# Patient Record
Sex: Female | Born: 1965 | Hispanic: No | Smoking: Former smoker
Health system: Southern US, Community
[De-identification: ages and names within clinical notes are randomized; demographics above are authoritative.]

## PROBLEM LIST (undated history)

## (undated) DIAGNOSIS — IMO0002 Reserved for concepts with insufficient information to code with codable children: Secondary | ICD-10-CM

## (undated) DIAGNOSIS — J189 Pneumonia, unspecified organism: Secondary | ICD-10-CM

## (undated) DIAGNOSIS — N63 Unspecified lump in unspecified breast: Secondary | ICD-10-CM

## (undated) DIAGNOSIS — N644 Mastodynia: Secondary | ICD-10-CM

## (undated) DIAGNOSIS — N809 Endometriosis, unspecified: Secondary | ICD-10-CM

## (undated) DIAGNOSIS — Z8742 Personal history of other diseases of the female genital tract: Secondary | ICD-10-CM

## (undated) HISTORY — DX: Unspecified lump in unspecified breast: N63.0

## (undated) HISTORY — DX: Personal history of other diseases of the female genital tract: Z87.42

## (undated) HISTORY — DX: Mastodynia: N64.4

## (undated) HISTORY — DX: Pneumonia, unspecified organism: J18.9

## (undated) HISTORY — PX: LAPAROSCOPIC ABDOMINAL EXPLORATION: SHX6249

## (undated) HISTORY — DX: Reserved for concepts with insufficient information to code with codable children: IMO0002

## (undated) HISTORY — DX: Endometriosis, unspecified: N80.9

---

## 1998-10-19 ENCOUNTER — Inpatient Hospital Stay (HOSPITAL_COMMUNITY): Admission: AD | Admit: 1998-10-19 | Discharge: 1998-10-19 | Payer: Self-pay | Admitting: *Deleted

## 1999-10-20 ENCOUNTER — Inpatient Hospital Stay (HOSPITAL_COMMUNITY): Admission: AD | Admit: 1999-10-20 | Discharge: 1999-10-20 | Payer: Self-pay | Admitting: Obstetrics & Gynecology

## 1999-12-16 ENCOUNTER — Inpatient Hospital Stay (HOSPITAL_COMMUNITY): Admission: AD | Admit: 1999-12-16 | Discharge: 1999-12-18 | Payer: Self-pay | Admitting: Obstetrics and Gynecology

## 1999-12-19 ENCOUNTER — Encounter: Admission: RE | Admit: 1999-12-19 | Discharge: 2000-03-18 | Payer: Self-pay | Admitting: *Deleted

## 2000-03-20 ENCOUNTER — Encounter: Admission: RE | Admit: 2000-03-20 | Discharge: 2000-05-19 | Payer: Self-pay | Admitting: *Deleted

## 2000-06-17 ENCOUNTER — Encounter: Admission: RE | Admit: 2000-06-17 | Discharge: 2000-07-05 | Payer: Self-pay | Admitting: *Deleted

## 2002-04-28 ENCOUNTER — Other Ambulatory Visit: Admission: RE | Admit: 2002-04-28 | Discharge: 2002-04-28 | Payer: Self-pay | Admitting: Obstetrics and Gynecology

## 2003-02-27 ENCOUNTER — Encounter: Admission: RE | Admit: 2003-02-27 | Discharge: 2003-02-27 | Payer: Self-pay | Admitting: Internal Medicine

## 2003-03-09 ENCOUNTER — Encounter: Admission: RE | Admit: 2003-03-09 | Discharge: 2003-03-09 | Payer: Self-pay | Admitting: Internal Medicine

## 2003-08-14 ENCOUNTER — Other Ambulatory Visit: Admission: RE | Admit: 2003-08-14 | Discharge: 2003-08-14 | Payer: Self-pay | Admitting: Obstetrics and Gynecology

## 2003-09-11 ENCOUNTER — Encounter: Admission: RE | Admit: 2003-09-11 | Discharge: 2003-09-11 | Payer: Self-pay | Admitting: Obstetrics and Gynecology

## 2004-07-26 ENCOUNTER — Other Ambulatory Visit: Admission: RE | Admit: 2004-07-26 | Discharge: 2004-07-26 | Payer: Self-pay | Admitting: Obstetrics and Gynecology

## 2006-05-23 ENCOUNTER — Encounter: Admission: RE | Admit: 2006-05-23 | Discharge: 2006-05-23 | Payer: Self-pay | Admitting: Internal Medicine

## 2006-10-18 ENCOUNTER — Encounter: Admission: RE | Admit: 2006-10-18 | Discharge: 2006-10-18 | Payer: Self-pay | Admitting: Obstetrics and Gynecology

## 2007-05-13 ENCOUNTER — Encounter: Admission: RE | Admit: 2007-05-13 | Discharge: 2007-05-13 | Payer: Self-pay | Admitting: Internal Medicine

## 2007-12-19 ENCOUNTER — Encounter: Admission: RE | Admit: 2007-12-19 | Discharge: 2007-12-19 | Payer: Self-pay | Admitting: Obstetrics and Gynecology

## 2007-12-26 ENCOUNTER — Encounter: Admission: RE | Admit: 2007-12-26 | Discharge: 2007-12-26 | Payer: Self-pay | Admitting: Obstetrics and Gynecology

## 2010-02-26 ENCOUNTER — Encounter: Payer: Self-pay | Admitting: Internal Medicine

## 2010-02-27 ENCOUNTER — Encounter: Payer: Self-pay | Admitting: Obstetrics and Gynecology

## 2010-05-06 ENCOUNTER — Other Ambulatory Visit: Payer: Self-pay | Admitting: Internal Medicine

## 2010-05-06 ENCOUNTER — Ambulatory Visit
Admission: RE | Admit: 2010-05-06 | Discharge: 2010-05-06 | Disposition: A | Discharge status: Home / Self Care | Payer: Self-pay | Source: Ambulatory Visit | Attending: Internal Medicine | Admitting: Internal Medicine

## 2010-05-06 DIAGNOSIS — R05 Cough: Secondary | ICD-10-CM

## 2010-06-24 NOTE — Op Note (Signed)
[facility]  Patient:    ASANI, DENISTON           MRN: [id number] Proc. Date: 12/16/99 Adm. Date:  [id number] Attending:  Dierdre Forth Pearline                           Operative Report  PREOPERATIVE DIAGNOSES:       Fetal tachycardia, maternal exhaustion.  OBSTETRICIAN:                 Cecilio Asper, M.D.  PROCEDURE:                    Low forceps vaginal delivery.  ANESTHESIA:                   Epidural.  ESTIMATED BLOOD LOSS:         350 cc.  FINDINGS:                     Viable female infant.  Apgars 9 and 9.  Weight ______.  ______ meconium.  COMPLICATIONS:                None.  HISTORY:                      Patient is a 45 year old gravida 1, para 0 who presented in active labor.  She progressed with augmentation of Pitocin to complete dilatation.  The patient progressed somewhat ineffectively for close to two hours with epidural anesthesia.  At that point the patient stated that she could not push anymore and was feeling exhausted.  Patients temperature was greater than 101 and did not resolve despite Tylenol or antibiotics and increasing IV fluids.  Fetal heart tracing at that point was between 160s-170s.  There was good short-term beat to beat variability, little long-term beat to beat variability with a negative CST.  At that point operative vaginal delivery was offered to the patient and her family.  The vertex was felt to be directed away.  The pelvis was felt to be adequate. Again, maternal pushing effort was fair.  Contractions on Pitocin were felt to be strong.  There were no contraindications to operative vaginal delivery and the patient and her husband consented.  DESCRIPTION OF PROCEDURE:     Foley was removed from the patients bladder as well as complete internal monitoring.  The ______ Lodema Hong forceps were placed x 1 without difficulty.  Vaginal suture was directly in the midline of the blade.   With the next maternal pushing effort, the left hand on the forceps shank and my right hand on the blade, the infant was brought to crowning.  The forceps were then removed and Nigel Bridgeman, C.N.M. proceeded to complete the delivery.  Please see her note in the patients chart.  After delivery of the infant inspection of the vagina and cervix was performed.  There were no sulcus or cervical lacerations.  There was a midline second degree laceration that Nigel Bridgeman felt comfortable repairing.  At this point this Kelson Queenan left the room and mother and infant were doing well. DD:  12/16/99 TD:  12/16/99 Job: [id number]

## 2010-06-24 NOTE — Discharge Summary (Signed)
[facility]  Patient:    Michelle Hendrix, Michelle Hendrix           MRN: [id number] Adm. Date:  [id number] Disc. Date: 12/18/99 Attending:  Shaune Spittle Dictator:   Mack Guise, C.N.M.                           Discharge Summary  ADMITTING DIAGNOSIS:          Intrauterine pregnancy at term in labor.  PROCEDURES:                   Low forceps vaginal delivery.  DISCHARGE DIAGNOSIS:          Fetal tachycardia and maternal exhaustion with low forceps vaginal delivery.  HISTORY:                      The patient is a 45 year old gravida 1, para 0 at term who presented in active labor.  Her labor was complicated by fetal tachycardia and maternal exhaustion.  She underwent low forceps vaginal delivery by Dr. Cleatrice Burke with birth of a viable female infant named Michelle Hendrix.  Weight was 7 pounds 15 ounces with Apgar scores of 9 at one minute and 9 at five minutes.  Her hemoglobin on the first postpartum day was 8.8 and white blood cell count was 26.6.  Today, the second postpartum day, white blood cell count 18.3 and hemoglobin 7.7, platelets 164,000.  The patients postoperative course has been uncomplicated and she is judged to be in satisfactory condition for discharge.  DISCHARGE INSTRUCTIONS        Per [facility] handout.  DISCHARGE MEDICATIONS:        1. Motrin 600 mg p.o. q.6h. p.r.n. pain.                               2. Tylox, 1-2 p.o. q.3-4h. p.r.n. pain.                               3. Prenatal vitamins.  NOTE:                         The patient will decide on contraception at her six week postpartum visit.  FOLLOW UP:                   At [facility] in six weeks. DD:  12/18/99 TD:  12/18/99 Job: [id number]

## 2010-06-24 NOTE — H&P (Signed)
[facility]  Patient:    Michelle Hendrix, Michelle Hendrix           MRN: [id number] Adm. Date:  [id number] Disc. Date: [id number] Attending:  Cleatrice Burke Dictator:   Mack Guise, C.N.M.                         History and Physical  HISTORY OF PRESENT ILLNESS:   The patient is a 45 year old gravida 1, para 0 at 39-6/7ths weeks, EDD December 16, 1999, who presents with increasing signs and symptoms of labor.  She reports positive fetal movement, no bleeding, no rupture of membranes.  Denies any headache, visual changes or epigastric pain. She was initially evaluated about 12:30 this a.m. and was 1 cm dilated, she ambulated for one hour and has now progressed to 3, 90%, -1 and is admitted for labor.  Her pregnancy has been followed by the C.N.M. service at [facility] and is remarkable for: 1. A history of endometriosis.  2. First trimester bleeding.  3. History of cholelithiasis with no surgery.  4. Group B strep negative.  This patient was initially evaluated at the office of [facility] on July 22, 1999, at approximately [redacted] weeks gestation.  Her pregnancy has been essentially uncomplicated.  PRENATAL LABORATORY WORK:     Finds hemoglobin 13.4, platelets 206,000.  Blood type and Rh O positive.  Antibody screen negative.  VDRL nonreactive.  Rubella positive.  Hepatitis B surface antigen negative.  HIV negative.  MSAFP testing within normal limits.  One-hour glucose challenge 123 and at 36 weeks culture of the vaginal tract is negative for group B strep.  MEDICAL HISTORY:              History of endometrial and ovarian cysts removed by laparoscopy.  The patient has a history of hyperthyroidism for which she took medication for eight years; lab work has all been within normal limits at present.  FAMILY HISTORY:               The patients mother has a history of MI, father with varicosities.  The patients mother has a history of diabetes.  GENETIC HISTORY:               There is no family history of genetic or chromosomal disorders, children that died in infancy or that were born with birth defects.  ALLERGIES:                    No known drug allergies.  HABITS:                       The patient denies the use of tobacco, alcohol or illicit drugs.  REVIEW OF SYSTEMS:            There are no signs or symptoms suggestive of focal or systemic disease, and the patient is typical of one with a uterine pregnancy at term in early labor.  SOCIAL HISTORY:               The patient is a 45 year old [place] married female.  Her husband, Zlatco, is involved and supportive.  They are of the [place] faith.  PHYSICAL EXAMINATION:  VITAL SIGNS:                  Vital signs stable, afebrile.  HEENT:  Unremarkable.  LUNGS:                        Clear.  HEART:                        Regular rate and rhythm.  ABDOMEN:                      Soft and nontender, gravid in its contour. Uterine fundus is noted to extend 40 cm above the level of the pubic symphysis.  Leopolds maneuver finds the infant to be in a longitudinal lie, cephalic presentation and the estimated fetal weight is 7.5-8 pounds.  PELVIC:                       Digital exam of the cervix finds it to be 3 cm dilated, 90% effaced with the cephalic presenting part at a -1 station and membranes intact.  ASSESSMENT:                   1. Intrauterine pregnancy at term.                               2. Early labor.  PLAN:                         1. Admit to birthing suites.                               2. Routine C.N.M. orders.                               3. Heparin lock IV.                               4. Stadol 1 mg plus Phenergan 12.5 mg IV for                                  pain.                               5. Follow expectantly in anticipation of a                                  spontaneous vaginal delivery.                               6. The patient  and her husband verbalized                                  understanding and agreement with above plan. DD:  12/16/99 TD:  12/16/99 Job: [id number]

## 2011-11-23 ENCOUNTER — Encounter: Payer: BC Managed Care – PPO | Admitting: Physical Therapy

## 2011-11-30 ENCOUNTER — Encounter: Payer: BC Managed Care – PPO | Admitting: Physical Therapy

## 2011-12-08 DIAGNOSIS — IMO0002 Reserved for concepts with insufficient information to code with codable children: Secondary | ICD-10-CM

## 2011-12-08 HISTORY — DX: Reserved for concepts with insufficient information to code with codable children: IMO0002

## 2011-12-17 DIAGNOSIS — IMO0002 Reserved for concepts with insufficient information to code with codable children: Secondary | ICD-10-CM | POA: Insufficient documentation

## 2012-01-02 ENCOUNTER — Encounter: Payer: Self-pay | Admitting: Obstetrics and Gynecology

## 2012-01-02 ENCOUNTER — Ambulatory Visit (INDEPENDENT_AMBULATORY_CARE_PROVIDER_SITE_OTHER): Payer: BC Managed Care – PPO | Admitting: Obstetrics and Gynecology

## 2012-01-02 ENCOUNTER — Other Ambulatory Visit: Payer: Self-pay | Admitting: Internal Medicine

## 2012-01-02 VITALS — BP 106/78 | HR 76 | Ht 65.0 in | Wt 180.0 lb

## 2012-01-02 DIAGNOSIS — R52 Pain, unspecified: Secondary | ICD-10-CM

## 2012-01-02 DIAGNOSIS — R350 Frequency of micturition: Secondary | ICD-10-CM

## 2012-01-02 DIAGNOSIS — R140 Abdominal distension (gaseous): Secondary | ICD-10-CM

## 2012-01-02 DIAGNOSIS — Z01419 Encounter for gynecological examination (general) (routine) without abnormal findings: Secondary | ICD-10-CM

## 2012-01-02 DIAGNOSIS — Z124 Encounter for screening for malignant neoplasm of cervix: Secondary | ICD-10-CM

## 2012-01-02 DIAGNOSIS — R141 Gas pain: Secondary | ICD-10-CM

## 2012-01-02 LAB — POCT URINALYSIS DIPSTICK
Bilirubin, UA: NEGATIVE
Ketones, UA: NEGATIVE
Spec Grav, UA: 1.01
pH, UA: 5

## 2012-01-02 NOTE — Progress Notes (Signed)
Subjective:    Michelle Hendrix is a 46 y.o. female, G1P1, who presents for an annual exam. She is not sexually active.  She complains of bloating.  She has not had a period for 3 months.  She does have symptoms like a period is about to start. She complains of constipation. She has a history of ovarian cysts. She admits that she is not exercising.  She is now divorced.  She does receive child support.    History   Social History  . Marital Status: Married    Spouse Name: N/A    Number of Children: N/A  . Years of Education: N/A   Social History Main Topics  . Smoking status: Former Games developer  . Smokeless tobacco: None  . Alcohol Use: No  . Drug Use: No  . Sexually Active: Not Currently    Birth Control/ Protection: Abstinence   Other Topics Concern  . None   Social History Narrative  . None    Menstrual cycle:   LMP: Patient's last menstrual period was 10/30/2011.           Cycle: no periods for 3 months.  The following portions of the patient's history were reviewed and updated as appropriate: allergies, current medications, past family history, past medical history, past social history, past surgical history and problem list.  Review of Systems Pertinent items are noted in HPI. Breast:Negative for breast lump,nipple discharge or nipple retraction Gastrointestinal: Negative for abdominal pain, change in bowel habits or rectal bleeding Urinary:negative   Objective:    BP 106/78  Pulse 76  Ht 5\' 5"  (1.651 m)  Wt 180 lb (81.647 kg)  BMI 29.95 kg/m2  LMP 10/30/2011    Weight:  Wt Readings from Last 1 Encounters:  01/02/12 180 lb (81.647 kg)          BMI: Body mass index is 29.95 kg/(m^2).  General Appearance: Alert, appropriate appearance for age. No acute distress HEENT: Grossly normal Neck / Thyroid: Supple, no masses, nodes or enlargement Lungs: clear to auscultation bilaterally Back: No CVA tenderness Breast Exam: No masses or nodes.No dimpling,  nipple retraction or discharge. Cardiovascular: Regular rate and rhythm. S1, S2, no murmur Gastrointestinal: Soft, non-tender, no masses or organomegaly  ++++++++++++++++++++++++++++++++++++++++++++++++++++++++  Pelvic Exam: External genitalia: normal general appearance Vaginal: normal without tenderness, induration or masses and relaxation noted. Cervix: normal appearance Adnexa: normal bimanual exam Uterus: normal size shape and consistency Rectovaginal: normal rectal, no masses  ++++++++++++++++++++++++++++++++++++++++++++++++++++++++  Lymphatic Exam: Non-palpable nodes in neck, clavicular, axillary, or inguinal regions Neurologic: Normal speech, no tremor  Psychiatric: Alert and oriented, appropriate affect.   Assessment:    Normal gyn exam   Overweight or obese: Yes   Pelvic relaxation: Yes  Irregular menstrual cycles consistent with perimenopause  Abdominal bloating  Constipation   Plan:    mammogram pap smear Contraception:abstinence  GYN ultrasound in 4 weeks.   STD screen request: No   The updated Pap smear screening guidelines were discussed with the patient. The patient requested that I obtain a Pap smear: Yes.  Kegel exercises discussed: Yes.  Proper diet and regular exercise were reviewed.  Annual mammograms recommended starting at age 34. Proper breast care was discussed.  Screening colonoscopy is recommended beginning at age 28.  Regular health maintenance was reviewed.  Sleep hygiene was discussed.  Adequate calcium and vitamin D intake was emphasized.  Leonard Schwartz M.D.    Regular Periods: no Mammogram: yes  Monthly Breast Ex.: yes Exercise: no  Tetanus < 10 years: yes Seatbelts: yes  NI. Bladder Functn.: no, frequency  Abuse at home: no  Daily BM's: no, constipation  Stressful Work: no  Healthy Diet: yes tries  Sigmoid-Colonoscopy: n/a  Calcium: no Medical problems this year: irregular periods    LAST PAP: 12/22/09  wnl  Contraception: Abstinence  Mammogram:  12/26/07  PCP: Dr. Daiva Eves  PMH: anemia   FMH: no changes  Last Bone Scan:  n/a

## 2012-01-03 LAB — PAP IG W/ RFLX HPV ASCU

## 2012-01-09 ENCOUNTER — Ambulatory Visit
Admission: RE | Admit: 2012-01-09 | Discharge: 2012-01-09 | Disposition: A | Discharge status: Home / Self Care | Payer: BC Managed Care – PPO | Source: Ambulatory Visit | Attending: Internal Medicine | Admitting: Internal Medicine

## 2012-01-09 DIAGNOSIS — R52 Pain, unspecified: Secondary | ICD-10-CM

## 2012-01-18 ENCOUNTER — Other Ambulatory Visit: Payer: Self-pay | Admitting: Obstetrics and Gynecology

## 2012-01-18 ENCOUNTER — Telehealth: Payer: Self-pay | Admitting: Obstetrics and Gynecology

## 2012-01-18 NOTE — Telephone Encounter (Signed)
Pt states she has been bleeding for 12 days and is feeling weak and dizziness.

## 2012-01-19 ENCOUNTER — Other Ambulatory Visit: Payer: Self-pay | Admitting: Obstetrics and Gynecology

## 2012-01-19 ENCOUNTER — Ambulatory Visit (INDEPENDENT_AMBULATORY_CARE_PROVIDER_SITE_OTHER): Payer: BC Managed Care – PPO | Admitting: Obstetrics and Gynecology

## 2012-01-19 ENCOUNTER — Encounter: Payer: Self-pay | Admitting: Obstetrics and Gynecology

## 2012-01-19 ENCOUNTER — Telehealth: Payer: Self-pay

## 2012-01-19 ENCOUNTER — Ambulatory Visit (INDEPENDENT_AMBULATORY_CARE_PROVIDER_SITE_OTHER): Payer: BC Managed Care – PPO

## 2012-01-19 VITALS — BP 110/60 | Resp 16 | Wt 180.0 lb

## 2012-01-19 DIAGNOSIS — R140 Abdominal distension (gaseous): Secondary | ICD-10-CM

## 2012-01-19 DIAGNOSIS — N926 Irregular menstruation, unspecified: Secondary | ICD-10-CM

## 2012-01-19 DIAGNOSIS — R141 Gas pain: Secondary | ICD-10-CM

## 2012-01-19 DIAGNOSIS — N83209 Unspecified ovarian cyst, unspecified side: Secondary | ICD-10-CM

## 2012-01-19 DIAGNOSIS — N809 Endometriosis, unspecified: Secondary | ICD-10-CM

## 2012-01-19 MED ORDER — MEDROXYPROGESTERONE ACETATE 10 MG PO TABS: 10.0000 mg | ORAL_TABLET | Freq: Every day | ORAL | Status: DC

## 2012-01-19 NOTE — Telephone Encounter (Signed)
Pt was called to schedule colpo  Pt not ava  LM for pt to call back

## 2012-01-19 NOTE — Progress Notes (Signed)
Subjective:    Michelle Hendrix is a 46 y.o. female, G1P1, who presents for Gyn ultrasound because of Heavy Bleeding . The patient did not have a period for 3 months. She has now had a cycle for 12 days. The patient reports that the bloating and she wants had has now resolved. She is currently being treated by her family physician for anemia. The patient has a past history of endometriosis. Laparoscopy was performed 15 years ago. An ultrasound in October 2012 shows that her right ovary measured 5.7 x 3.7 cm. The appearance was consistent with an endometrioma or perhaps a dermoid cysts. Repeat laparoscopy was discussed/tear but then declined.  The following portions of the patient's history were reviewed and updated as appropriate: allergies, current medications, past family history.  Objective:    BP 110/60  Resp 16  Wt 180 lb (81.647 kg)  LMP 01/08/2012    Weight:  Wt Readings from Last 1 Encounters:  01/19/12 180 lb (81.647 kg)          BMI: There is no height on file to calculate BMI.  Abdomen: Soft and nontender. No masses are appreciated.  Pelvic exam: Deferred because of menstrual cycle.  ULTRASOUND: Uterus 9.5 x 6.8 cm    Adnexa left ovary is normal. Right ovary has 3 cystic structures with the largest measuring 2.7 x 2.2 cm. This seemed consistent with endometriosis.    Endometrium 11.7 mm    Other findings:  No nodularity is seen. Normal Dopplers.   Assessment:    irregular menstrual bleeding   Right ovarian cyst consistent with endometriosis  Improved bloating   Plan:    The patient wants Provera 10 mg each day for 10 days to stop her bleeding. I recommended that the patient allow her bleeding to take its course.  CT scan versus laparoscopy were discussed. Risk and benefits reviewed. The patient wants to check her insurance benefits for a CT scan. She will call and let Michelle know how she wants to proceed. CA 125 was discussed but declined from now.  [name]

## 2012-01-22 ENCOUNTER — Encounter: Payer: BC Managed Care – PPO | Admitting: Obstetrics and Gynecology

## 2012-01-22 ENCOUNTER — Other Ambulatory Visit: Payer: BC Managed Care – PPO

## 2012-01-22 ENCOUNTER — Telehealth: Payer: Self-pay | Admitting: Obstetrics and Gynecology

## 2012-01-22 NOTE — Telephone Encounter (Signed)
Late entry from 01/18/12:  Pt called, states has been having bleeding x 12 days and for the past 7 days has been having to change a soaked regular pad Q 2 hrs.  Is now feeling dizzy and weak and shivering, this is all after not having a cycle x 2 1/2 months.  Consulted w/ AVS, pt offered an appt to come in tomorrow (01/19/12) for an [name] and f/u visit with him.  Pt can only be allowed to get off of work @ 1030 and says she will be able to get to the office by 1045.  Discussed w/ sonographers after reviewing schedule.  Per Diane, if pt can get here by 1045 will work her in for an [name] before a visit w/ AVS.  Pt states she will be here.  Will have f/u w/ AVS @ 1100.

## 2012-02-16 DIAGNOSIS — N920 Excessive and frequent menstruation with regular cycle: Secondary | ICD-10-CM | POA: Insufficient documentation

## 2012-02-23 ENCOUNTER — Other Ambulatory Visit: Payer: Self-pay | Admitting: Obstetrics and Gynecology

## 2012-02-23 DIAGNOSIS — Z1231 Encounter for screening mammogram for malignant neoplasm of breast: Secondary | ICD-10-CM

## 2012-02-26 ENCOUNTER — Ambulatory Visit
Admission: RE | Admit: 2012-02-26 | Discharge: 2012-02-26 | Disposition: A | Discharge status: Home / Self Care | Payer: BC Managed Care – PPO | Source: Ambulatory Visit | Attending: Obstetrics and Gynecology | Admitting: Obstetrics and Gynecology

## 2012-02-26 DIAGNOSIS — Z1231 Encounter for screening mammogram for malignant neoplasm of breast: Secondary | ICD-10-CM

## 2012-02-27 ENCOUNTER — Telehealth: Payer: Self-pay | Admitting: Obstetrics and Gynecology

## 2012-02-27 DIAGNOSIS — N644 Mastodynia: Secondary | ICD-10-CM

## 2012-02-27 NOTE — Telephone Encounter (Signed)
Sent message to AVS asking was it ok to place mammogram orders.  [facility] CMA

## 2012-02-29 NOTE — Telephone Encounter (Signed)
Orders for diagnostic MMG and u/s put into system. Contacted [facility] they will call pt with apt. Pt aware and voiced understanding  Darien Ramus, CMA

## 2012-02-29 NOTE — Addendum Note (Signed)
Addended by: Darien Ramus on: 02/29/2012 09:39 AM   Modules accepted: Orders

## 2012-03-01 ENCOUNTER — Other Ambulatory Visit: Payer: Self-pay

## 2012-03-01 DIAGNOSIS — N6452 Nipple discharge: Secondary | ICD-10-CM

## 2012-03-01 DIAGNOSIS — N644 Mastodynia: Secondary | ICD-10-CM

## 2012-03-12 ENCOUNTER — Ambulatory Visit
Admission: RE | Admit: 2012-03-12 | Discharge: 2012-03-12 | Disposition: A | Discharge status: Home / Self Care | Payer: BC Managed Care – PPO | Source: Ambulatory Visit | Attending: Obstetrics and Gynecology | Admitting: Obstetrics and Gynecology

## 2012-03-12 DIAGNOSIS — N644 Mastodynia: Secondary | ICD-10-CM

## 2012-04-01 ENCOUNTER — Ambulatory Visit: Payer: BC Managed Care – PPO | Admitting: Obstetrics and Gynecology

## 2012-04-01 ENCOUNTER — Encounter: Payer: Self-pay | Admitting: Obstetrics and Gynecology

## 2012-04-01 VITALS — BP 120/82 | Wt 182.0 lb

## 2012-04-01 DIAGNOSIS — N644 Mastodynia: Secondary | ICD-10-CM

## 2012-04-01 DIAGNOSIS — N63 Unspecified lump in unspecified breast: Secondary | ICD-10-CM

## 2012-04-01 DIAGNOSIS — N920 Excessive and frequent menstruation with regular cycle: Secondary | ICD-10-CM

## 2012-04-01 DIAGNOSIS — N915 Oligomenorrhea, unspecified: Secondary | ICD-10-CM

## 2012-04-01 DIAGNOSIS — N912 Amenorrhea, unspecified: Secondary | ICD-10-CM

## 2012-04-01 LAB — CBC
MCHC: 33.6 g/dL (ref 30.0–36.0)
Platelets: 245 10*3/uL (ref 150–400)
RDW: 13.7 % (ref 11.5–15.5)
WBC: 5.2 10*3/uL (ref 4.0–10.5)

## 2012-04-01 LAB — PROGESTERONE: Progesterone: 0.3 ng/mL

## 2012-04-01 NOTE — Progress Notes (Addendum)
47 YO with a normal Diagnostic  MG in March 12, 2012 complains of left breast lump. Denies any pain, or nipple discharge though she did have breast pain from October until January.  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  In January 09, 2012 started to bleed and continued until 02/21/2012.  This bleeding stopped after a 10 day course of Provera 10 mg.  Working at [employer] and is in school at [employer] (though taking this semester off.  O: Breasts: no masses, nipple discharge, retractions, skin changes or axillary adenopathy.  UPT-negative   A:  LGSIL      Left Breast "Lump" (normal Dx MG)/Pain      Oligomenorrhea-Peri-menopausal bleeding       H/O Menorrhagia      Request for hormone testing  P: Refer to general surgeon for evaluation of left breast "lump"      Reviewed peri-menopausal bleeding, anovulation and management options         TSH, Estradiol, CBC  and progesterone      Schedule Colposcopy       Urged decrease in caffiene-per patient has drastically decreased intake       RTO-for colposcopy  Donnovan Stamour, PA-C

## 2012-04-01 NOTE — Addendum Note (Signed)
Addended by: Henreitta Leber on: 04/01/2012 10:22 AM   Modules accepted: Orders

## 2012-04-02 ENCOUNTER — Telehealth: Payer: Self-pay

## 2012-04-03 ENCOUNTER — Ambulatory Visit (INDEPENDENT_AMBULATORY_CARE_PROVIDER_SITE_OTHER): Payer: Self-pay | Admitting: General Surgery

## 2012-04-03 ENCOUNTER — Encounter (INDEPENDENT_AMBULATORY_CARE_PROVIDER_SITE_OTHER): Payer: Self-pay | Admitting: General Surgery

## 2012-04-03 ENCOUNTER — Ambulatory Visit (INDEPENDENT_AMBULATORY_CARE_PROVIDER_SITE_OTHER): Payer: BC Managed Care – PPO | Admitting: General Surgery

## 2012-04-03 VITALS — BP 132/88 | HR 74 | Temp 97.8°F | Resp 16 | Ht 65.0 in | Wt 182.2 lb

## 2012-04-03 DIAGNOSIS — N644 Mastodynia: Secondary | ICD-10-CM

## 2012-04-03 NOTE — Progress Notes (Signed)
Patient ID: Michelle Hendrix, female   DOB: February 25, 1965, 47 y.o.   MRN: [id number]  Chief Complaint  Patient presents with  . New Evaluation    Breast lump    HPI Modest Michelle Hendrix is a 47 y.o. female.  She is referred by Dr. Marline Backbone for evaluation of left breast pain and perceived lop.  The patient is from [place] and has a little bit of a language barrier, but not bad. She has a past history of endometriosis and had a laparoscopy interment before that, details unknown. She began having irregular menorrhagia in October. She says Dr. Stefano Hendrix told this might be menopause. She was put on hormones and the bleeding stopped in January. She was having bilateral breast pain at the same time, left breast greater than right, and that pain has now resolved after receiving hormonal therapy as well.  She states she feels something laterally in the left breast, not really a lump just a funny feeling.. There has been no nipple discharge or skin change. She does not feel any enlarged lymph nodes.  It is significant that she reports at least one aunt with breast cancer and one of the and she children, a first cousin, died of breast cancer. Her mother and sister do not have any history of breast disease.   She is divorced. Has one child.  The entire encounter was chaperoned by [name]. HPI  Past Medical History  Diagnosis Date  . Endometriosis   . Mastodynia, female   . Hx of ovarian cyst   . Pneumonia     h/o  . Abnormal Pap smear 11/13  . Breast pain in female   . Breast lump     Past Surgical History  Procedure Laterality Date  . Laparoscopic abdominal exploration      Family History  Problem Relation Age of Onset  . Hypertension Father   . Heart disease Father   . Diabetes Father   . Heart disease Mother   . Hypertension Mother   . Diabetes Mother   . Cancer Maternal Aunt     breast  . Cancer Paternal Aunt     breast  . Cancer Cousin     breast     Social History History  Substance Use Topics  . Smoking status: Former Smoker    Quit date: 04/03/2009  . Smokeless tobacco: Never Used  . Alcohol Use: No    Allergies  Allergen Reactions  . Shellfish Allergy     Current Outpatient Prescriptions  Medication Sig Dispense Refill  . Multiple Vitamins-Minerals (MULTIVITAMIN WITH MINERALS) tablet Take 1 tablet by mouth daily.       No current facility-administered medications for this visit.    Review of Systems Review of Systems  Constitutional: Negative for fever, chills and unexpected weight change.  HENT: Negative for hearing loss, congestion, sore throat, trouble swallowing and voice change.   Eyes: Negative for visual disturbance.  Respiratory: Negative for cough and wheezing.   Cardiovascular: Negative for chest pain, palpitations and leg swelling.  Gastrointestinal: Negative for nausea, vomiting, abdominal pain, diarrhea, constipation, blood in stool, abdominal distention and anal bleeding.  Genitourinary: Negative for hematuria, vaginal bleeding and difficulty urinating.       Not sexually active.  Musculoskeletal: Negative for arthralgias.  Skin: Negative for rash and wound.  Neurological: Negative for seizures, syncope and headaches.  Hematological: Negative for adenopathy. Does not bruise/bleed easily.  Psychiatric/Behavioral: Negative for confusion. The patient is nervous/anxious.  Blood pressure 132/88, pulse 74, temperature 97.8 F (36.6 C), temperature source Temporal, resp. rate 16, height 5\' 5"  (1.651 m), weight 182 lb 4 oz (82.668 kg), last menstrual period 01/09/2012.  Physical Exam Physical Exam  Constitutional: She is oriented to person, place, and time. She appears well-developed and well-nourished. No distress.  HENT:  Head: Normocephalic and atraumatic.  Mouth/Throat: No oropharyngeal exudate.  Eyes: Conjunctivae and EOM are normal. Pupils are equal, round, and reactive to light. Left eye  exhibits no discharge. No scleral icterus.  Neck: Neck supple. No JVD present. No tracheal deviation present. No thyromegaly present.  Cardiovascular: Normal rate, regular rhythm, normal heart sounds and intact distal pulses.   No murmur heard. Pulmonary/Chest: Effort normal and breath sounds normal. No respiratory distress. She has no wheezes. She has no rales. She exhibits no tenderness.  Breast exam bilaterally reveals the breasts are slightly lumpy diffusely. No dominant mass. No skin change. Nipple and areola looked normal. No axillary or supraclavicular adenopathy.  Abdominal: Soft. Bowel sounds are normal. She exhibits no distension and no mass. There is no tenderness. There is no rebound and no guarding.  Small scar at lower umbilical rim.  Musculoskeletal: She exhibits no edema and no tenderness.  Lymphadenopathy:    She has no cervical adenopathy.  Neurological: She is alert and oriented to person, place, and time. She exhibits normal muscle tone. Coordination normal.  Skin: Skin is warm. No rash noted. She is not diaphoretic. No erythema. No pallor.  Psychiatric: She has a normal mood and affect. Her behavior is normal. Judgment and thought content normal.  She is a little bit nervous and a little bit shy but very appropriate and cooperative.    Data Reviewed Recent mammograms. Mammograms. Office notes from Dr. Stefano Hendrix.  Assessment    Bilateral breast pain, left greater than right, now resolved. No focal abnormality detected on physical exam or by mammography. Low-risk findings. No surgical intervention indicated  Family history of breast cancer in multiple second line relatives  History endometriosis  Recent history irregular menstrual flow , resolved with hormonal intervention. Breast pain resolved as well following hormonal intervention.     Plan    I had a long conversation with the patient about her mammograms and her breast exam and her family history. I advised her  that there was no finding to suggest breast cancer and no indication for surgery. She seemed to appreciate this.  I stressed the need for annual breast exam and annual mammography, and she understands that.  I will be happy to see her again if any new breast problems arise. Otherwise see me PRN.        Lakaya Tolen M 04/03/2012, 3:31 PM

## 2012-04-03 NOTE — Patient Instructions (Signed)
Your breast exam today is essentially normal. The skin and the lymph nodes in the area are also normal. I think that the discomfort that you had is more likely due to hormonal changes. There is no evidence of breast cancer.  Because of your family history of breast cancer in your aunts and in a first cousin, you need to be sure that she get an annual breast exam and annual mammography from here on out.  Return to see Dr. Derrell Lolling if any further breast problems arise.

## 2012-04-08 ENCOUNTER — Telehealth: Payer: Self-pay | Admitting: Obstetrics and Gynecology

## 2012-04-08 MED ORDER — PROGESTERONE MICRONIZED 100 MG PO CAPS: ORAL_CAPSULE | ORAL | Status: AC

## 2012-04-08 NOTE — Telephone Encounter (Signed)
Call to patient to advise of normal TSH, CBC Estradiol and low normal progesterone.  Patient is perimenopausal and therefore probably does not ovulate regularly.  She would like to take progesterone.  Given option of oral vs. transdermal and would like oral.  Denies allergy to peanuts and is aware that it will make her sleepy.  Reviewed dosing regimen-qhs day 14-28 of each month.  Patient also advised that it may  take several months before she would notice a difference in the way that she feels.POWELL,ELMIRA, PA-C

## 2012-04-11 ENCOUNTER — Telehealth: Payer: Self-pay

## 2012-04-11 NOTE — Telephone Encounter (Signed)
Pt wants a copy of pap Results when she comes for 04-15-2012 office visit.   [facility] CMA

## 2012-04-11 NOTE — Telephone Encounter (Signed)
Colpo scheduled for 04-15-12 w/VH Ok per CW, CMA  Pt agreeable.  [facility] CMA

## 2012-04-15 ENCOUNTER — Encounter: Payer: Self-pay | Admitting: Obstetrics and Gynecology

## 2012-04-15 ENCOUNTER — Ambulatory Visit: Payer: BC Managed Care – PPO | Admitting: Obstetrics and Gynecology

## 2012-04-15 VITALS — BP 108/60 | Wt 184.0 lb

## 2012-04-15 DIAGNOSIS — N912 Amenorrhea, unspecified: Secondary | ICD-10-CM

## 2012-04-15 DIAGNOSIS — IMO0002 Reserved for concepts with insufficient information to code with codable children: Secondary | ICD-10-CM

## 2012-04-15 DIAGNOSIS — N920 Excessive and frequent menstruation with regular cycle: Secondary | ICD-10-CM

## 2012-04-15 LAB — POCT URINE PREGNANCY: Preg Test, Ur: NEGATIVE

## 2012-04-15 NOTE — Progress Notes (Signed)
GYN PROBLEM VISIT  Subjective: [name] is a 47 y.o. year old female,G1P1, who presents for a problem visit.  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  LMP12/2013 with 3 weeeks of bleeding into January.  Pt used Progesterone for 5 days and bleeding stopped.  No further bleeding since then   Objective:  LMP 01/09/2012 BP 108/60  Wt 184 lb (83.462 kg)  BMI 30.62 kg/m2  LMP 01/09/2012 Abdomen: Soft without masses or organomegaly.  External genitalia: normal general appearance Vaginal: normal mucosa without prolapse or lesions Cervix: Nabothian cyst at 530.  See colposcopy for details Adnexa: No palpable masses Uterus: slightly enlarged with minimal tenderness  Colposcopy Procedure Note  Indications: Pap smear 4 months ago showed: low-grade squamous intraepithelial neoplasia (LGSIL - encompassing HPV,mild dysplasia,CIN I). The prior pap showed no abnormalities.  Prior cervical/vaginal disease: none. Prior cervical treatment: no treatment.  Procedure Details  The risks and benefits of the procedure and Written informed consent obtained.  Speculum placed in vagina and excellent visualization of cervix achieved, cervix swabbed x 3 with acetic acid solution.  Findings: Cervix: no visible lesions; endocervical speculum placed, SCJ visualized 360 degrees without lesions, no biopsies taken, endocervical curettage performed and specimen labelled and sent to pathology. Vaginal inspection: vaginal colposcopy not performed. Vulvar colposcopy: vulvar colposcopy not performed.  Specimens: ECC  Complications: none.  Plan: Specimens labelled and sent to Pathology. Will base further treatment on Pathology  findings. Treatment options discussed with patient. Post biopsy instructions given to patient.   Assessment: LGSIL Perimenopausal bleeding pattern Mastodynia improved Ovarian cyst Hx endometriosis  Plan: ECC done Pt to take Provera 10 mg daily for 5 days starting 05/07/12 if no menses by then. Return to office for U/S and follow up   Dierdre Forth, MD  04/15/2012 4:01 PM

## 2012-04-16 LAB — PAP IG W/ RFLX HPV ASCU

## 2012-04-18 LAB — PATHOLOGY

## 2012-04-30 ENCOUNTER — Other Ambulatory Visit: Payer: Self-pay | Admitting: Obstetrics and Gynecology

## 2012-04-30 ENCOUNTER — Encounter: Payer: Self-pay | Admitting: Obstetrics and Gynecology

## 2013-02-11 ENCOUNTER — Other Ambulatory Visit: Payer: Self-pay | Admitting: Internal Medicine

## 2013-02-11 DIAGNOSIS — R51 Headache: Principal | ICD-10-CM

## 2013-02-11 DIAGNOSIS — R519 Headache, unspecified: Secondary | ICD-10-CM

## 2013-02-11 DIAGNOSIS — R42 Dizziness and giddiness: Secondary | ICD-10-CM

## 2013-02-18 ENCOUNTER — Inpatient Hospital Stay: Admission: RE | Admit: 2013-02-18 | Payer: BC Managed Care – PPO | Source: Ambulatory Visit

## 2013-12-08 ENCOUNTER — Encounter: Payer: Self-pay | Admitting: Obstetrics and Gynecology

## 2014-02-25 IMAGING — US US SOFT TISSUE HEAD/NECK
1 series · 14 of 25 positions shown · non-contrast
Comparison: None.

CLINICAL DATA: Of tenderness, some dysphagia

THYROID ULTRASOUND
TECHNIQUE: Ultrasound examination of the thyroid gland and adjacent
soft tissues was performed.

[Series 1: us soft tissue head/neck · 0.08mm/px · 14 of 45 slices shown]
[im 1/45]
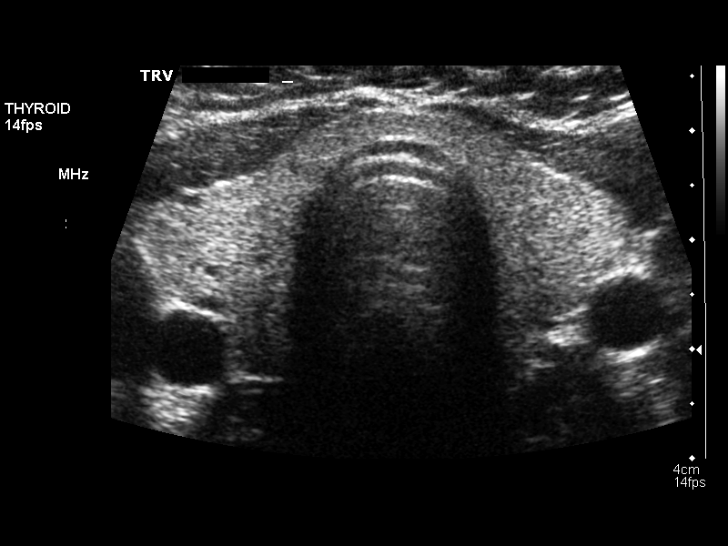
[im 4/45]
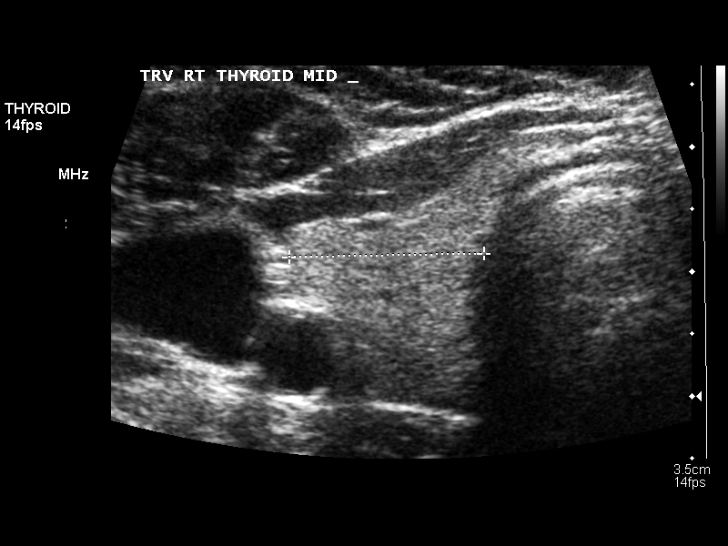
[im 8/45]
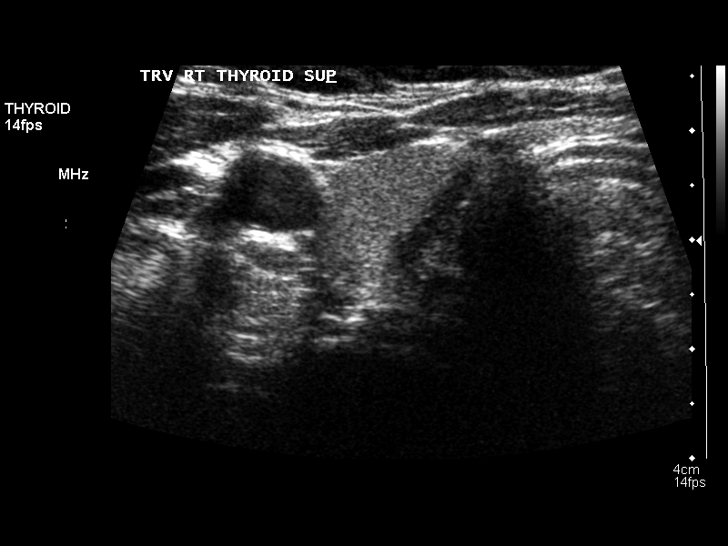
[im 12/45]
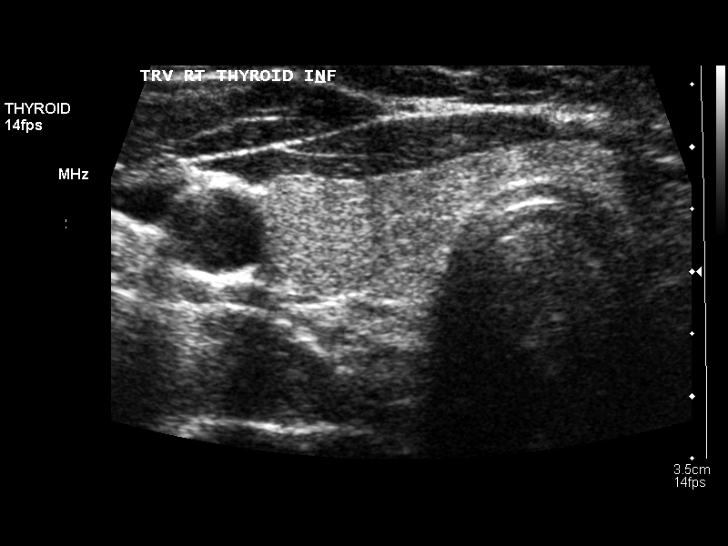
[im 15/45]
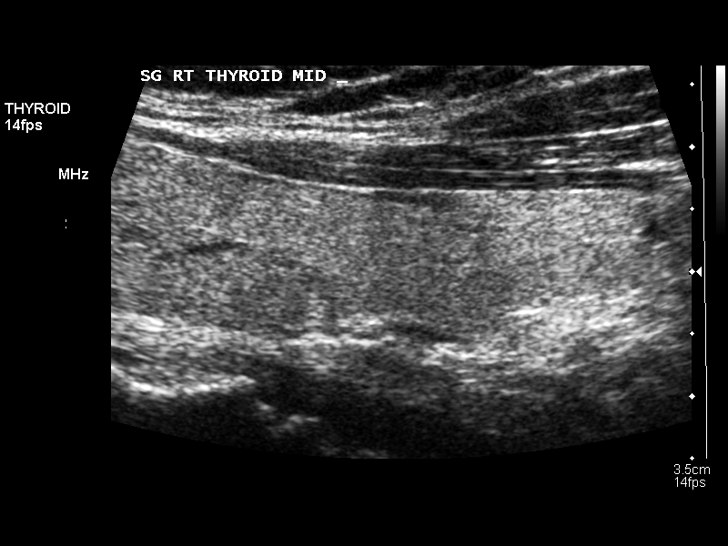
[im 17/45]
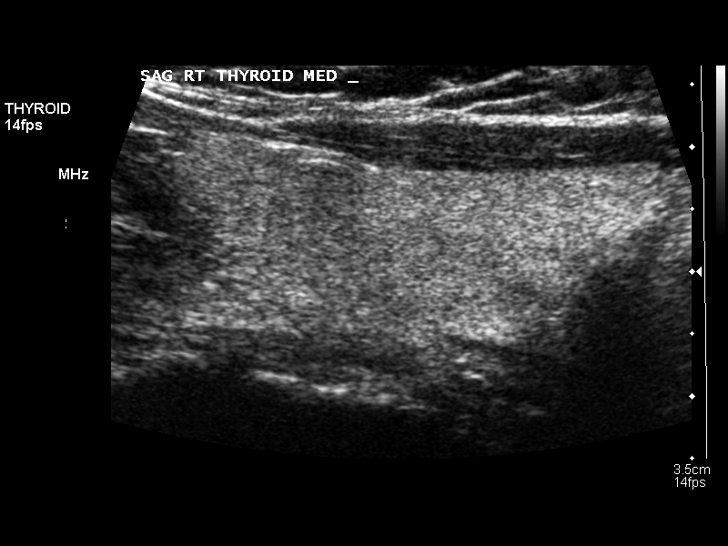
[im 21/45]
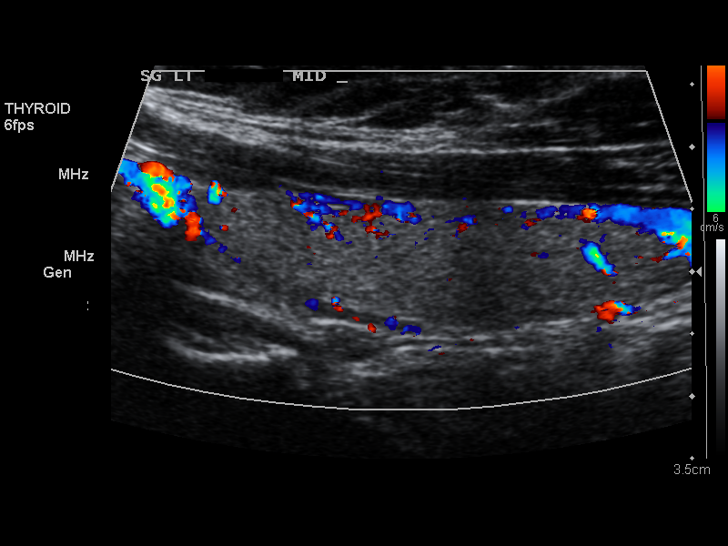
[im 24/45]
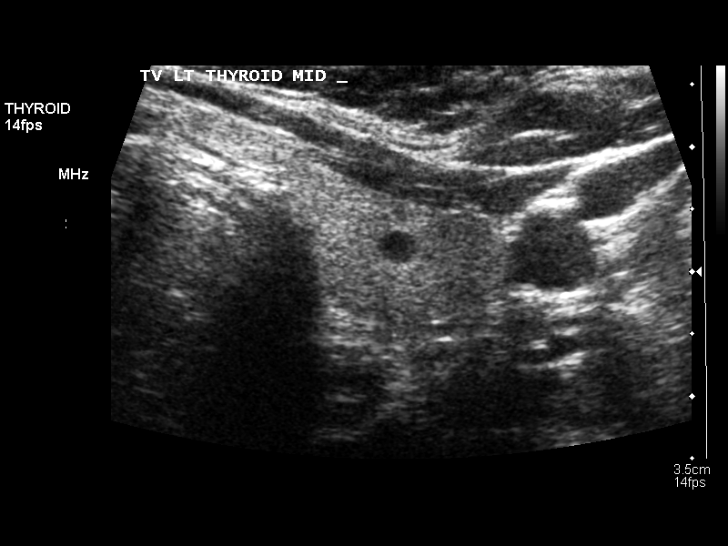
[im 28/45]
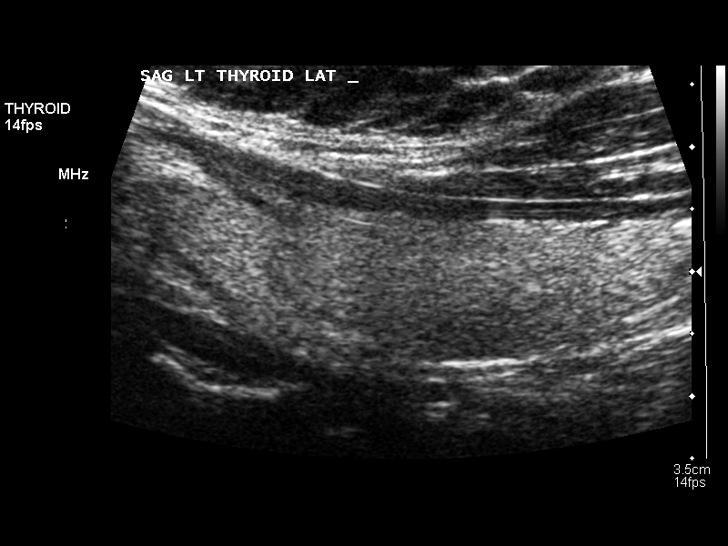
[im 30/45]
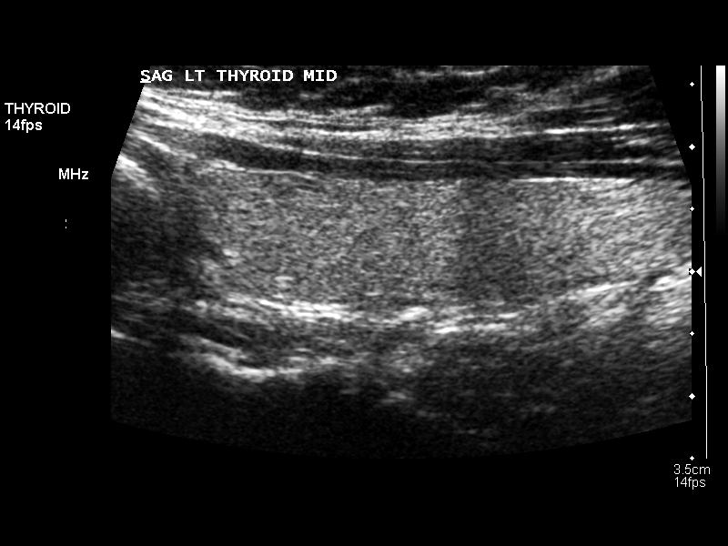
[im 34/45]
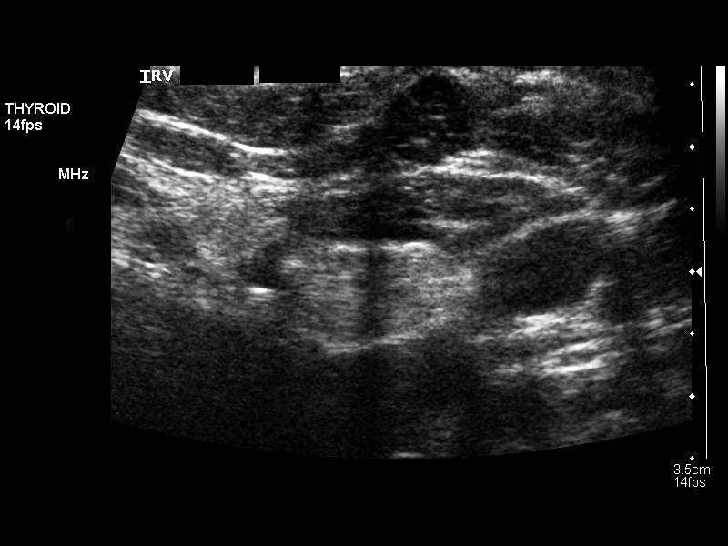
[im 37/45]
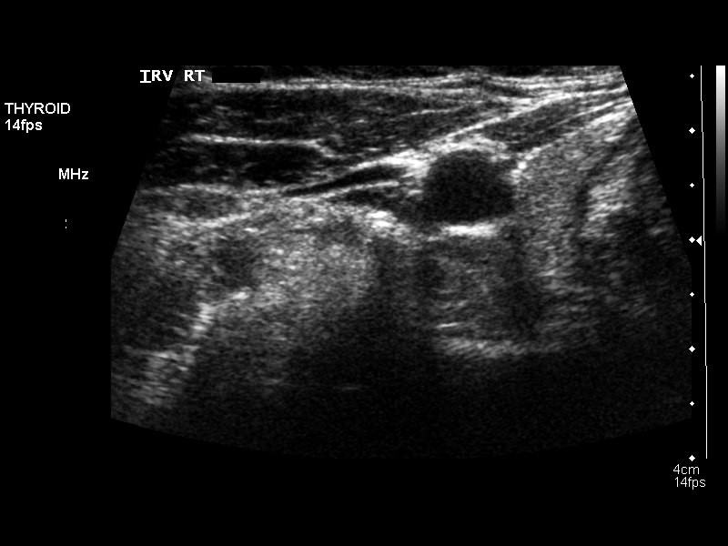
[im 41/45]
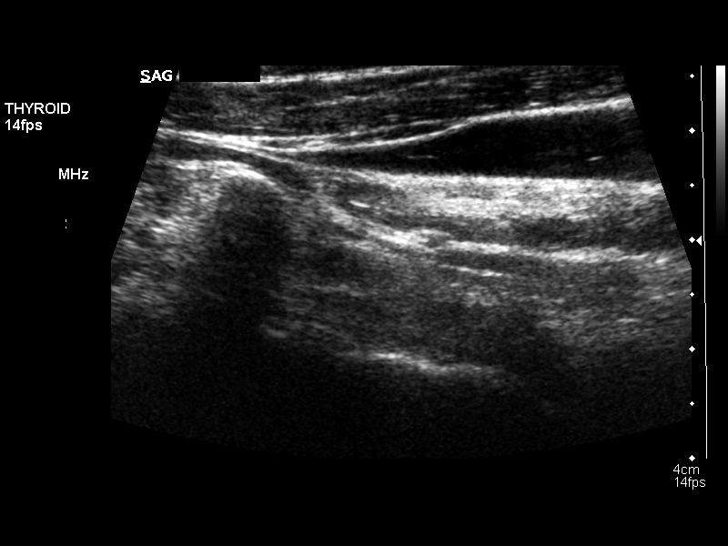
[im 45/45]
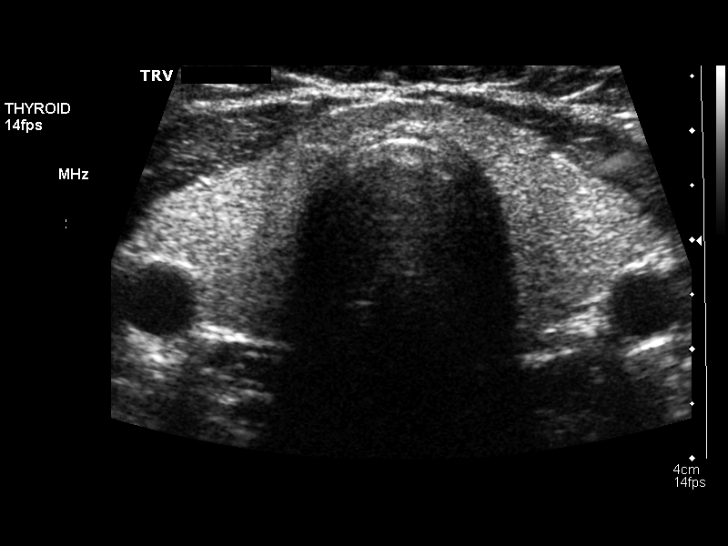

[14 of 25 positions shown; findings below may reference images not displayed]

FINDINGS: Right thyroid lobe:  6.3 x 1.5 x 1.6 cm.
Left thyroid lobe:  4.6 x 1.1 x 1.5 cm.
Isthmus:  3.5 mm in thickness.

Focal nodules:  The echogenicity of the thyroid parenchyma is
homogeneous.  Only tiny nodules are present of no more than 3 mm in
diameter, which appear hypoechoic.

Lymphadenopathy:  None visualized.
IMPRESSION: The thyroid gland is normal in size with only small hypoechoic
nodules present of no more than 3 mm in diameter.

## 2014-03-10 ENCOUNTER — Other Ambulatory Visit: Payer: Self-pay | Admitting: Obstetrics and Gynecology

## 2014-03-10 DIAGNOSIS — N644 Mastodynia: Secondary | ICD-10-CM

## 2014-03-13 ENCOUNTER — Other Ambulatory Visit: Payer: BC Managed Care – PPO

## 2014-03-19 ENCOUNTER — Ambulatory Visit
Admission: RE | Admit: 2014-03-19 | Discharge: 2014-03-19 | Disposition: A | Discharge status: Home / Self Care | Payer: BC Managed Care – PPO | Source: Ambulatory Visit | Attending: Obstetrics and Gynecology | Admitting: Obstetrics and Gynecology

## 2014-03-19 ENCOUNTER — Other Ambulatory Visit: Payer: BC Managed Care – PPO

## 2014-03-19 DIAGNOSIS — N644 Mastodynia: Secondary | ICD-10-CM

## 2014-07-07 ENCOUNTER — Ambulatory Visit
Admission: RE | Admit: 2014-07-07 | Discharge: 2014-07-07 | Disposition: A | Discharge status: Home / Self Care | Payer: BC Managed Care – PPO | Source: Ambulatory Visit | Attending: Internal Medicine | Admitting: Internal Medicine

## 2014-07-07 ENCOUNTER — Other Ambulatory Visit: Payer: Self-pay | Admitting: Internal Medicine

## 2014-07-07 DIAGNOSIS — T1490XA Injury, unspecified, initial encounter: Secondary | ICD-10-CM

## 2015-12-23 ENCOUNTER — Other Ambulatory Visit: Payer: Self-pay | Admitting: Internal Medicine

## 2015-12-23 ENCOUNTER — Ambulatory Visit
Admission: RE | Admit: 2015-12-23 | Discharge: 2015-12-23 | Disposition: A | Discharge status: Home / Self Care | Payer: BC Managed Care – PPO | Source: Ambulatory Visit | Attending: Internal Medicine | Admitting: Internal Medicine

## 2015-12-23 DIAGNOSIS — E041 Nontoxic single thyroid nodule: Secondary | ICD-10-CM

## 2016-06-28 ENCOUNTER — Ambulatory Visit
Admission: RE | Admit: 2016-06-28 | Discharge: 2016-06-28 | Disposition: A | Discharge status: Home / Self Care | Payer: BC Managed Care – PPO | Source: Ambulatory Visit | Attending: Internal Medicine | Admitting: Internal Medicine

## 2016-06-28 ENCOUNTER — Other Ambulatory Visit: Payer: Self-pay | Admitting: Internal Medicine

## 2016-06-28 DIAGNOSIS — R059 Cough, unspecified: Secondary | ICD-10-CM

## 2016-06-28 DIAGNOSIS — R05 Cough: Secondary | ICD-10-CM

## 2016-06-28 DIAGNOSIS — R0602 Shortness of breath: Secondary | ICD-10-CM

## 2018-02-08 IMAGING — US US THYROID
1 series · 14 of 25 positions shown · non-contrast
Comparison: 01/09/2012

CLINICAL DATA: Choking sensation x3 weeks, dysphagia

EXAM:
THYROID ULTRASOUND
TECHNIQUE: Ultrasound examination of the thyroid gland and adjacent soft
tissues was performed.

[Series 1: us thyroid · 0.08mm/px · 14 of 53 slices shown]
[im 1/53]
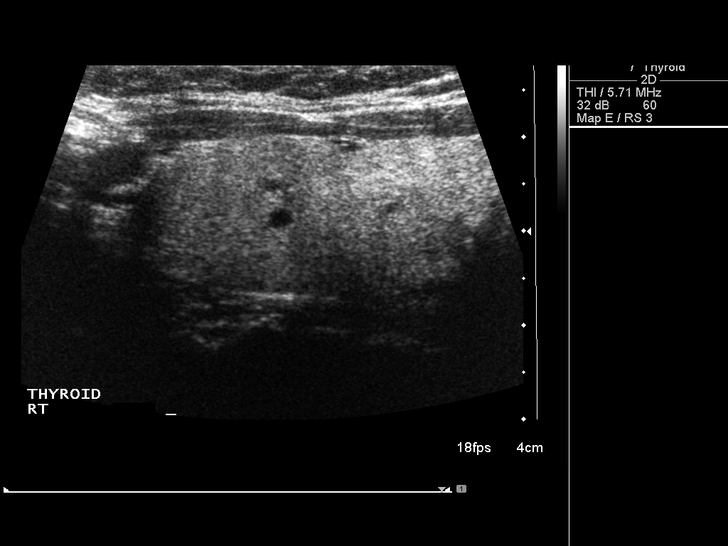
[im 5/53]
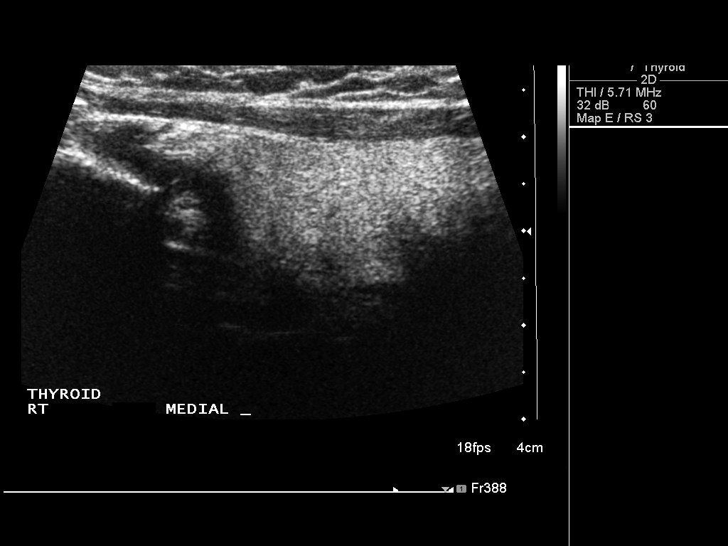
[im 9/53]
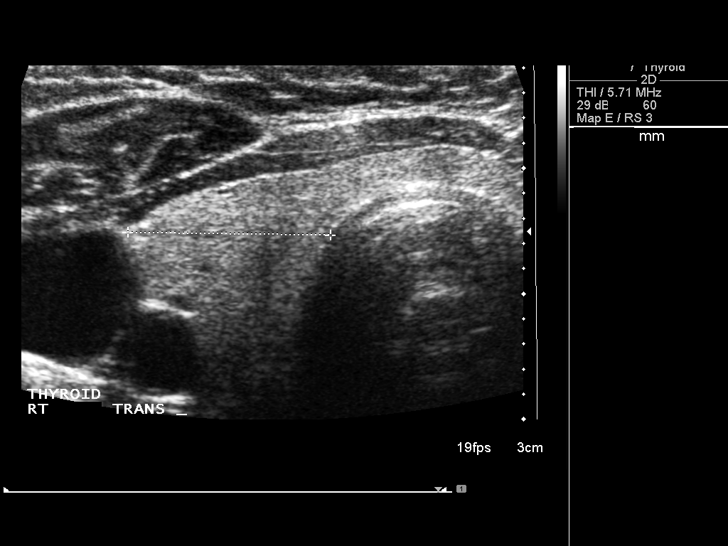
[im 14/53]
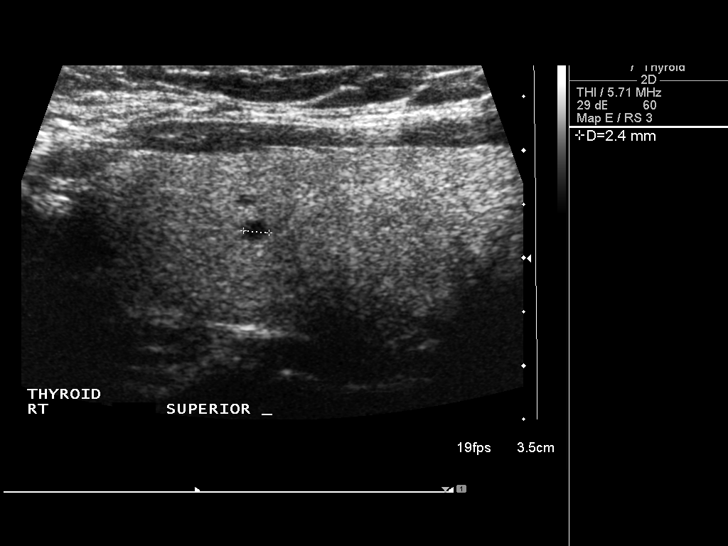
[im 18/53]
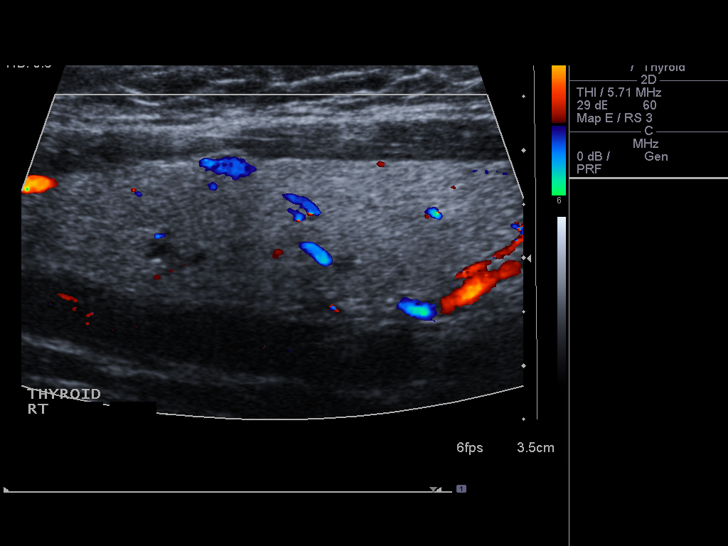
[im 20/53]
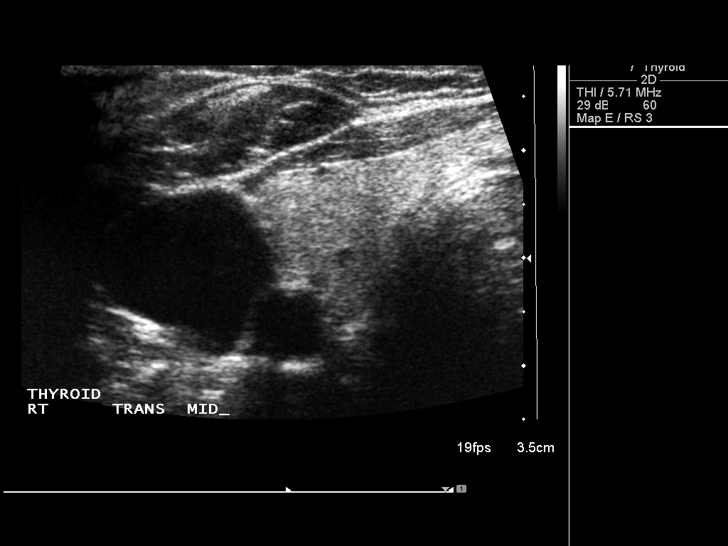
[im 24/53]
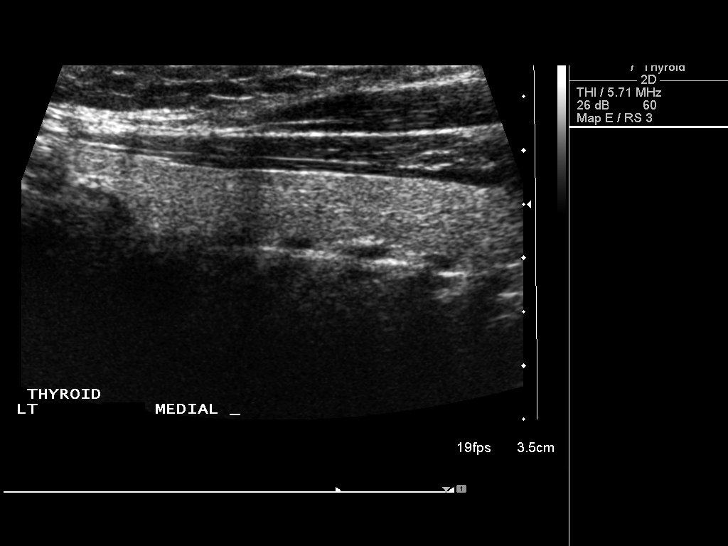
[im 29/53]
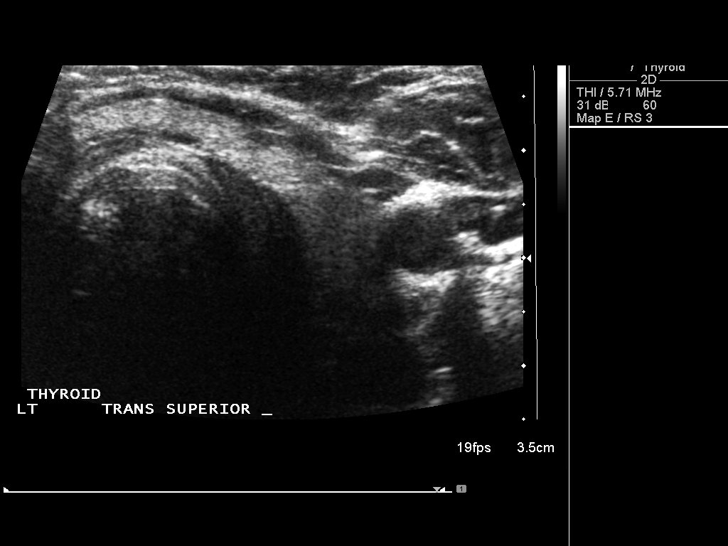
[im 33/53]
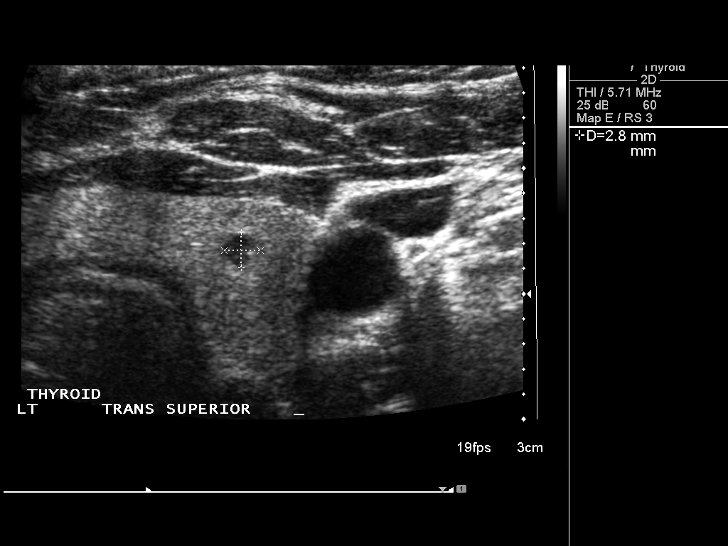
[im 35/53]
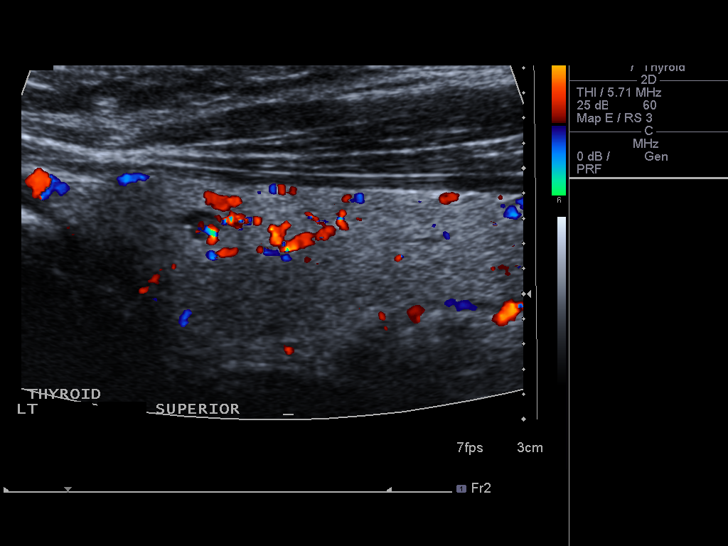
[im 40/53]
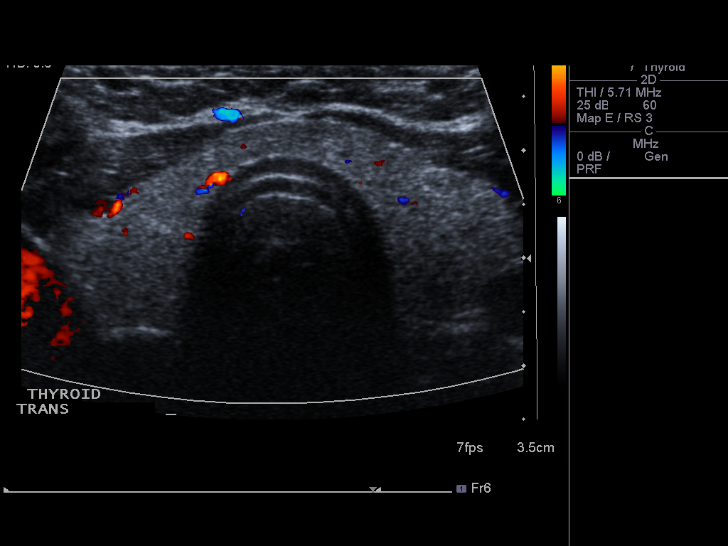
[im 44/53]
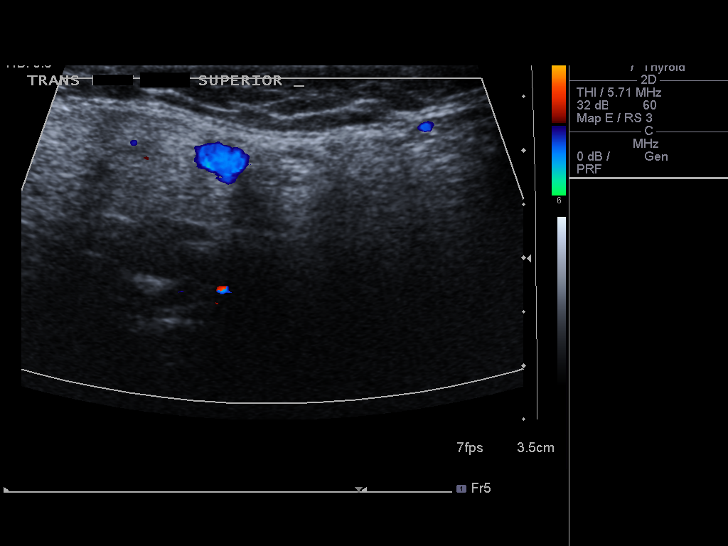
[im 48/53]
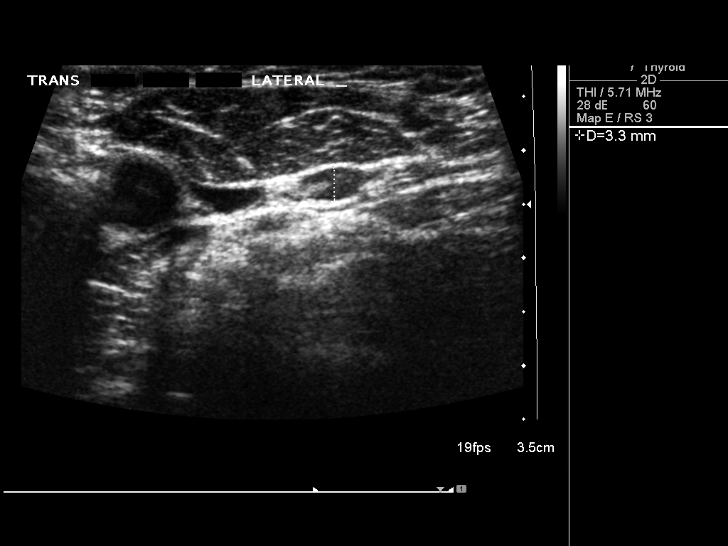
[im 53/53]
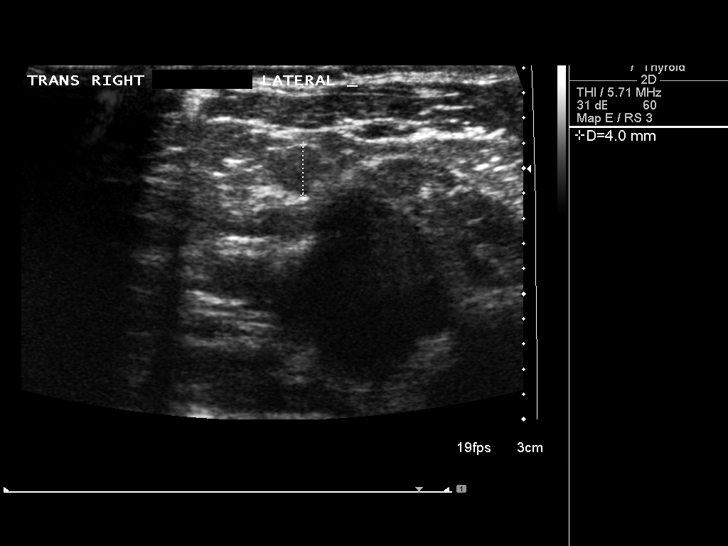

[14 of 25 positions shown; findings below may reference images not displayed]

FINDINGS: Parenchymal Echotexture: Mildly heterogenous

Estimated total number of nodules >/= 1 cm: 0

Number of spongiform nodules >/=  2 cm not described below (TR1): 0

Number of mixed cystic and solid nodules >/= 1.5 cm not described
below (TR2): 0

_________________________________________________________

Isthmus: 0.3 cm thickness

No discrete nodules are identified within the thyroid isthmus.

_________________________________________________________

Right lobe: 5.7 x 1.7 x 1.6 cm (previously 6.3 x 1.5 x 1.6)

Scattered small cysts less than 3 mm.  No nodule.

_________________________________________________________

Left lobe: 4.8 x 1.4 x 1.4 cm (previously 4.6 x 1.1 x 1.5)

4 mm colloid cyst, superior pole.  No solid nodule.
IMPRESSION: 1. Stable thyromegaly.  No nodule.

The above is in keeping with the ACR TI-RADS recommendations - [HOSPITAL] 7220;[DATE].

## 2018-08-15 ENCOUNTER — Other Ambulatory Visit: Payer: Self-pay | Admitting: Internal Medicine

## 2018-08-15 DIAGNOSIS — Z1231 Encounter for screening mammogram for malignant neoplasm of breast: Secondary | ICD-10-CM

## 2018-09-27 ENCOUNTER — Ambulatory Visit: Payer: BC Managed Care – PPO

## 2018-11-08 ENCOUNTER — Ambulatory Visit: Payer: BC Managed Care – PPO

## 2018-12-24 ENCOUNTER — Ambulatory Visit: Payer: BC Managed Care – PPO

## 2019-09-17 ENCOUNTER — Other Ambulatory Visit: Payer: Self-pay | Admitting: Internal Medicine

## 2019-09-17 DIAGNOSIS — Z1231 Encounter for screening mammogram for malignant neoplasm of breast: Secondary | ICD-10-CM

## 2019-10-15 ENCOUNTER — Ambulatory Visit: Payer: BC Managed Care – PPO

## 2019-11-06 ENCOUNTER — Ambulatory Visit: Payer: BC Managed Care – PPO

## 2021-12-27 ENCOUNTER — Other Ambulatory Visit: Payer: Self-pay | Admitting: Internal Medicine

## 2021-12-27 DIAGNOSIS — Z1231 Encounter for screening mammogram for malignant neoplasm of breast: Secondary | ICD-10-CM

## 2024-01-21 ENCOUNTER — Other Ambulatory Visit: Payer: Self-pay | Admitting: Internal Medicine

## 2024-01-21 ENCOUNTER — Encounter: Payer: Self-pay | Admitting: Internal Medicine

## 2024-01-21 DIAGNOSIS — R1011 Right upper quadrant pain: Secondary | ICD-10-CM

## 2024-01-28 ENCOUNTER — Other Ambulatory Visit

## 2024-01-28 DIAGNOSIS — R1011 Right upper quadrant pain: Secondary | ICD-10-CM
